# Patient Record
Sex: Female | Born: 1955 | Race: Black or African American | Hispanic: No | Marital: Married | Smoking: Never smoker
Health system: Southern US, Community
[De-identification: ages and names within clinical notes are randomized; demographics above are authoritative.]

## PROBLEM LIST (undated history)

## (undated) DIAGNOSIS — I10 Essential (primary) hypertension: Secondary | ICD-10-CM

---

## 1999-04-21 ENCOUNTER — Ambulatory Visit (HOSPITAL_COMMUNITY): Admission: RE | Admit: 1999-04-21 | Discharge: 1999-04-21 | Payer: Self-pay | Admitting: Family Medicine

## 1999-04-21 ENCOUNTER — Encounter: Payer: Self-pay | Admitting: Family Medicine

## 2000-12-08 ENCOUNTER — Encounter: Payer: Self-pay | Admitting: Obstetrics and Gynecology

## 2000-12-08 ENCOUNTER — Encounter: Admission: RE | Admit: 2000-12-08 | Discharge: 2000-12-08 | Payer: Self-pay | Admitting: Obstetrics and Gynecology

## 2008-06-17 ENCOUNTER — Emergency Department (HOSPITAL_COMMUNITY): Admission: EM | Admit: 2008-06-17 | Discharge: 2008-06-17 | Payer: Self-pay | Admitting: Emergency Medicine

## 2008-06-20 ENCOUNTER — Emergency Department (HOSPITAL_BASED_OUTPATIENT_CLINIC_OR_DEPARTMENT_OTHER): Admission: EM | Admit: 2008-06-20 | Discharge: 2008-06-20 | Payer: Self-pay | Admitting: Emergency Medicine

## 2010-12-29 ENCOUNTER — Encounter: Payer: Self-pay | Admitting: Gastroenterology

## 2011-08-26 LAB — BASIC METABOLIC PANEL
CO2: 23
Chloride: 105
Creatinine, Ser: 0.7
GFR calc Af Amer: >60
Sodium: 137

## 2011-09-27 ENCOUNTER — Other Ambulatory Visit: Payer: Self-pay | Admitting: Gastroenterology

## 2011-09-29 ENCOUNTER — Ambulatory Visit
Admission: RE | Admit: 2011-09-29 | Discharge: 2011-09-29 | Disposition: A | Discharge status: Home / Self Care | Payer: BC Managed Care – PPO | Source: Ambulatory Visit | Attending: Gastroenterology | Admitting: Gastroenterology

## 2011-09-29 ENCOUNTER — Other Ambulatory Visit: Payer: Self-pay

## 2011-09-29 MED ORDER — IOHEXOL 300 MG/ML  SOLN
100.0000 mL | Freq: Once | INTRAMUSCULAR | Status: AC | PRN
  Administered 2011-09-29: 100 mL via INTRAVENOUS

## 2011-09-30 ENCOUNTER — Other Ambulatory Visit: Payer: Self-pay | Admitting: Obstetrics and Gynecology

## 2011-09-30 DIAGNOSIS — Z1231 Encounter for screening mammogram for malignant neoplasm of breast: Secondary | ICD-10-CM

## 2011-10-24 ENCOUNTER — Ambulatory Visit
Admission: RE | Admit: 2011-10-24 | Discharge: 2011-10-24 | Disposition: A | Discharge status: Home / Self Care | Payer: BC Managed Care – PPO | Source: Ambulatory Visit | Attending: Obstetrics and Gynecology | Admitting: Obstetrics and Gynecology

## 2011-10-24 DIAGNOSIS — Z1231 Encounter for screening mammogram for malignant neoplasm of breast: Secondary | ICD-10-CM

## 2013-02-27 ENCOUNTER — Other Ambulatory Visit: Payer: Self-pay | Admitting: Family Medicine

## 2013-02-27 ENCOUNTER — Ambulatory Visit
Admission: RE | Admit: 2013-02-27 | Discharge: 2013-02-27 | Disposition: A | Discharge status: Home / Self Care | Payer: BC Managed Care – PPO | Source: Ambulatory Visit | Attending: Family Medicine | Admitting: Family Medicine

## 2013-02-27 DIAGNOSIS — J189 Pneumonia, unspecified organism: Secondary | ICD-10-CM

## 2019-07-02 ENCOUNTER — Encounter (HOSPITAL_COMMUNITY): Payer: Self-pay

## 2019-07-02 ENCOUNTER — Emergency Department (HOSPITAL_COMMUNITY)
Admission: EM | Admit: 2019-07-02 | Discharge: 2019-07-03 | Discharge status: Against Medical Advice | Payer: BLUE CROSS/BLUE SHIELD | Attending: Emergency Medicine | Admitting: Emergency Medicine

## 2019-07-02 ENCOUNTER — Other Ambulatory Visit: Payer: Self-pay

## 2019-07-02 DIAGNOSIS — Z5321 Procedure and treatment not carried out due to patient leaving prior to being seen by health care provider: Secondary | ICD-10-CM | POA: Diagnosis not present

## 2019-07-02 DIAGNOSIS — M549 Dorsalgia, unspecified: Secondary | ICD-10-CM | POA: Diagnosis present

## 2019-07-02 NOTE — ED Triage Notes (Signed)
[facility]- pt coming from home with c/o fall. She sprayed some cleaner on the ground and slipped and fell. No blood thinners. She has pain to the back and left shoulder. No LOC. Alert and oriented with EMS. Hx of hypertension.   20g R hand  159/78 64 97% room air 97 temp

## 2019-07-03 ENCOUNTER — Encounter (HOSPITAL_BASED_OUTPATIENT_CLINIC_OR_DEPARTMENT_OTHER): Payer: Self-pay | Admitting: Emergency Medicine

## 2019-07-03 ENCOUNTER — Emergency Department (HOSPITAL_BASED_OUTPATIENT_CLINIC_OR_DEPARTMENT_OTHER)
Admission: EM | Admit: 2019-07-03 | Discharge: 2019-07-03 | Disposition: A | Discharge status: Home / Self Care | Payer: BLUE CROSS/BLUE SHIELD | Source: Home / Self Care | Attending: Emergency Medicine | Admitting: Emergency Medicine

## 2019-07-03 ENCOUNTER — Emergency Department (HOSPITAL_BASED_OUTPATIENT_CLINIC_OR_DEPARTMENT_OTHER): Payer: BLUE CROSS/BLUE SHIELD

## 2019-07-03 DIAGNOSIS — I10 Essential (primary) hypertension: Secondary | ICD-10-CM | POA: Insufficient documentation

## 2019-07-03 DIAGNOSIS — W19XXXA Unspecified fall, initial encounter: Secondary | ICD-10-CM

## 2019-07-03 DIAGNOSIS — Y998 Other external cause status: Secondary | ICD-10-CM | POA: Insufficient documentation

## 2019-07-03 DIAGNOSIS — W109XXA Fall (on) (from) unspecified stairs and steps, initial encounter: Secondary | ICD-10-CM | POA: Insufficient documentation

## 2019-07-03 DIAGNOSIS — Y929 Unspecified place or not applicable: Secondary | ICD-10-CM | POA: Insufficient documentation

## 2019-07-03 DIAGNOSIS — M25552 Pain in left hip: Secondary | ICD-10-CM | POA: Insufficient documentation

## 2019-07-03 DIAGNOSIS — Z79899 Other long term (current) drug therapy: Secondary | ICD-10-CM | POA: Insufficient documentation

## 2019-07-03 DIAGNOSIS — Y9389 Activity, other specified: Secondary | ICD-10-CM | POA: Insufficient documentation

## 2019-07-03 DIAGNOSIS — R079 Chest pain, unspecified: Secondary | ICD-10-CM | POA: Insufficient documentation

## 2019-07-03 HISTORY — DX: Essential (primary) hypertension: I10

## 2019-07-03 MED ORDER — ACETAMINOPHEN 500 MG PO TABS
1000.0000 mg | ORAL_TABLET | Freq: Once | ORAL | Status: AC
  Administered 2019-07-03: 1000 mg via ORAL
  Filled 2019-07-03: qty 2

## 2019-07-03 MED ORDER — LIDOCAINE 5 % EX PTCH: 1.0000 | MEDICATED_PATCH | CUTANEOUS | 0 refills | Status: AC

## 2019-07-03 MED ORDER — KETOROLAC TROMETHAMINE 30 MG/ML IJ SOLN
15.0000 mg | Freq: Once | INTRAMUSCULAR | Status: AC
  Administered 2019-07-03: 15 mg via INTRAVENOUS
  Filled 2019-07-03: qty 1

## 2019-07-03 MED ORDER — NAPROXEN 375 MG PO TABS: 375.0000 mg | ORAL_TABLET | Freq: Two times a day (BID) | ORAL | 0 refills | Status: AC

## 2019-07-03 NOTE — ED Provider Notes (Signed)
[facility] Provider Note   CSN: [id number] Arrival date & time: 07/03/19  0136     History   Chief Complaint Chief Complaint  Patient presents with  . Fall    HPI Donna Dougherty is a 63 y.o. female.     The history is provided by the patient and a relative.  Fall This is a new problem. The current episode started 3 to 5 hours ago. The problem occurs rarely. The problem has not changed since onset.Pertinent negatives include no chest pain, no abdominal pain, no headaches and no shortness of breath. Associated symptoms comments: Hit left posterior ribs and pelvic bone as she slid down 4 steps.  Did not hit head no LOC.  . Nothing aggravates the symptoms. Nothing relieves the symptoms. She has tried nothing for the symptoms. The treatment provided no relief.    Past Medical History:  Diagnosis Date  . Hypertension     There are no active problems to display for this patient.   History reviewed. No pertinent surgical history.   OB History   No obstetric history on file.      Home Medications    Prior to Admission medications   Medication Sig Start Date End Date Taking? Authorizing Provider  lidocaine (LIDODERM) 5 % Place 1 patch onto the skin daily. Remove & Discard patch within 12 hours or as directed by MD 07/03/19   Randal Buba, Beckham Capistran, MD  naproxen (NAPROSYN) 375 MG tablet Take 1 tablet (375 mg total) by mouth 2 (two) times daily. 07/03/19   Anddy Wingert, MD  Olmesartan-amLODIPine-HCTZ 40-10-25 MG TABS Take 1 tablet by mouth daily. 05/09/19   [provider]    Family History No family history on file.  Social History Social History   Tobacco Use  . Smoking status: Never Smoker  . Smokeless tobacco: Never Used  Substance Use Topics  . Alcohol use: Not Currently  . Drug use: Not Currently     Allergies   Patient has no known allergies.   Review of Systems Review of Systems  Constitutional: Negative for fever.   HENT: Negative for ear pain.   Eyes: Negative for visual disturbance.  Respiratory: Negative for chest tightness and shortness of breath.   Cardiovascular: Negative for chest pain, palpitations and leg swelling.  Gastrointestinal: Negative for abdominal pain.  Genitourinary: Negative for dysuria.  Musculoskeletal: Positive for arthralgias. Negative for back pain, gait problem, joint swelling, neck pain and neck stiffness.  Neurological: Negative for weakness, numbness and headaches.  Hematological: Negative for adenopathy.  All other systems reviewed and are negative.    Physical Exam Updated Vital Signs BP (!) 153/82 (BP Location: Left Arm)   Pulse 76   Temp 98.1 F (36.7 C) (Oral)   Resp 18   Ht 5\' 6"  (1.676 m)   Wt 74.8 kg   SpO2 100%   BMI 26.63 kg/m   Physical Exam Vitals signs and nursing note reviewed.  Constitutional:      General: She is not in acute distress.    Appearance: She is normal weight.  HENT:     Head: Normocephalic and atraumatic.     Nose: Nose normal.     Mouth/Throat:     Mouth: Mucous membranes are moist.  Eyes:     Conjunctiva/sclera: Conjunctivae normal.     Pupils: Pupils are equal, round, and reactive to light.  Neck:     Musculoskeletal: Normal range of motion and neck supple.  Cardiovascular:  Rate and Rhythm: Normal rate and regular rhythm.     Pulses: Normal pulses.     Heart sounds: Normal heart sounds.  Pulmonary:     Effort: Pulmonary effort is normal. No respiratory distress.     Breath sounds: Normal breath sounds.  Abdominal:     General: Abdomen is flat. Bowel sounds are normal.     Tenderness: There is no abdominal tenderness. There is no guarding or rebound.  Musculoskeletal: Normal range of motion.        General: No tenderness or deformity.     Left shoulder: Normal.     Left elbow: Normal.     Left wrist: Normal.     Left hip: Normal.     Left knee: Normal.     Left ankle: Normal. Achilles tendon normal.      Cervical back: Normal.     Thoracic back: Normal.     Lumbar back: Normal.     Left foot: Normal.  Skin:    General: Skin is warm and dry.     Capillary Refill: Capillary refill takes less than 2 seconds.  Neurological:     General: No focal deficit present.     Mental Status: She is alert and oriented to person, place, and time.  Psychiatric:        Mood and Affect: Mood normal.        Behavior: Behavior normal.      ED Treatments / Results  Labs (all labs ordered are listed, but only abnormal results are displayed) Labs Reviewed - No data to display  EKG None  Radiology Dg Chest 2 View  Result Date: 07/03/2019 CLINICAL DATA:  Recent fall with chest pain, initial encounter EXAM: CHEST - 2 VIEW COMPARISON:  02/27/2013 FINDINGS: Cardiac shadows within normal limits. Calcified granuloma is again noted in the right lung base. No focal infiltrate or sizable effusion is seen. No bony abnormality is noted. IMPRESSION: No acute abnormality noted. Electronically Signed   By: Alcide CleverMark  Lukens M.D.   On: 07/03/2019 02:43   Dg Hips Bilat W Or Wo Pelvis 5 Views  Result Date: 07/03/2019 CLINICAL DATA:  Recent fall EXAM: DG HIP (WITH OR WITHOUT PELVIS) 5+V BILAT COMPARISON:  None. FINDINGS: Pelvic ring is intact. Mild degenerative changes of the hip joints are noted bilaterally. No acute fracture or dislocation is noted. No soft tissue abnormality is seen. IMPRESSION: No acute abnormality noted. Electronically Signed   By: Alcide CleverMark  Lukens M.D.   On: 07/03/2019 02:44    Procedures Procedures (including critical care time)  Medications Ordered in ED Medications  ketorolac (TORADOL) 30 MG/ML injection 15 mg (15 mg Intravenous Given 07/03/19 0213)  acetaminophen (TYLENOL) tablet 1,000 mg (1,000 mg Oral Given 07/03/19 0213)     R.I.C.E. therapy.  Did not hit head no LOC.  No seizures no emesis.  I do not believe there is an acute intracranial abnormality at this time.  There is no indication for imaging  of the head or c spine.  She hit the ribs and pelvic bone.    Final Clinical Impressions(s) / ED Diagnoses   Final diagnoses:  Fall, initial encounter   Return for intractable cough, coughing up blood,fevers >100.4 unrelieved by medication, shortness of breath, intractable vomiting, chest pain, shortness of breath, weakness,numbness, changes in speech, facial asymmetry,abdominal pain, passing out,Inability to tolerate liquids or food, cough, altered mental status or any concerns. No signs of systemic illness or infection. The patient is nontoxic-appearing on exam  and vital signs are within normal limits.   I have reviewed the triage vital signs and the nursing notes. Pertinent labs &imaging results that were available during my care of the patient were reviewed by me and considered in my medical decision making (see chart for details).  After history, exam, and medical workup I feel the patient has been appropriately medically screened and is safe for discharge home. Pertinent diagnoses were discussed with the patient. Patient was given return precautions  ED Discharge Orders         Ordered    lidocaine (LIDODERM) 5 %  Every 24 hours     07/03/19 0252    naproxen (NAPROSYN) 375 MG tablet  2 times daily     07/03/19 0252           Dane Bloch, MD 07/03/19 [id number]

## 2019-07-03 NOTE — ED Notes (Signed)
Pts family stated they are leaving.

## 2019-07-03 NOTE — ED Triage Notes (Signed)
Pt arrives POV from [facility] after leaving without being seen. Reports mechanical fall. Slipped on wet step. Bruising to left flank. No LOC.

## 2022-04-26 ENCOUNTER — Ambulatory Visit (INDEPENDENT_AMBULATORY_CARE_PROVIDER_SITE_OTHER): Payer: BC Managed Care – PPO

## 2022-04-26 ENCOUNTER — Encounter: Payer: Self-pay | Admitting: Emergency Medicine

## 2022-04-26 ENCOUNTER — Ambulatory Visit
Admission: EM | Admit: 2022-04-26 | Discharge: 2022-04-26 | Disposition: A | Discharge status: Home / Self Care | Payer: BC Managed Care – PPO

## 2022-04-26 DIAGNOSIS — J069 Acute upper respiratory infection, unspecified: Secondary | ICD-10-CM

## 2022-04-26 DIAGNOSIS — R0602 Shortness of breath: Secondary | ICD-10-CM

## 2022-04-26 DIAGNOSIS — R059 Cough, unspecified: Secondary | ICD-10-CM

## 2022-04-26 MED ORDER — ALBUTEROL SULFATE HFA 108 (90 BASE) MCG/ACT IN AERS
2.0000 | INHALATION_SPRAY | Freq: Once | RESPIRATORY_TRACT | Status: AC
  Administered 2022-04-26: 2 via RESPIRATORY_TRACT

## 2022-04-26 MED ORDER — PREDNISONE 10 MG (21) PO TBPK: ORAL_TABLET | Freq: Every day | ORAL | 0 refills | Status: DC

## 2022-04-26 NOTE — ED Provider Notes (Signed)
[facility]    CSN: [id number] Arrival date & time: 04/26/22  1533      History   Chief Complaint Chief Complaint  Patient presents with   Cough    HPI Donna Dougherty is a 66 y.o. female.   Patient presents with nasal congestion and cough that has been present for approximately 1 week.  She also endorses some shortness of breath.  Denies history of asthma or COPD and patient is not a smoker.  Patient has taken Mucinex and NyQuil with no improvement in symptoms.  Her daughter is currently has similar symptoms.  Denies chest pain, sore throat, ear pain, nausea, vomiting, diarrhea, abdominal pain.   Cough  Past Medical History:  Diagnosis Date   Hypertension     There are no problems to display for this patient.   History reviewed. No pertinent surgical history.  OB History   No obstetric history on file.      Home Medications    Prior to Admission medications   Medication Sig Start Date End Date Taking? Authorizing Provider  Olmesartan-amLODIPine-HCTZ 40-10-25 MG TABS Take 1 tablet by mouth daily. 05/09/19  Yes [provider]  potassium chloride (KLOR-CON) 10 MEQ tablet Take 10 mEq by mouth daily. 04/25/22  Yes [provider]  predniSONE (STERAPRED UNI-PAK 21 TAB) 10 MG (21) TBPK tablet Take by mouth daily. Take 6 tabs by mouth daily  for 2 days, then 5 tabs for 2 days, then 4 tabs for 2 days, then 3 tabs for 2 days, 2 tabs for 2 days, then 1 tab by mouth daily for 2 days 04/26/22  Yes Jaylon Grode, [place] E, FNP  lidocaine (LIDODERM) 5 % Place 1 patch onto the skin daily. Remove & Discard patch within 12 hours or as directed by MD 07/03/19   Nicanor Alcon, April, MD  naproxen (NAPROSYN) 375 MG tablet Take 1 tablet (375 mg total) by mouth 2 (two) times daily. 07/03/19   Palumbo, April, MD    Family History History reviewed. No pertinent family history.  Social History Social History   Tobacco Use   Smoking status: Never   Smokeless tobacco: Never   Substance Use Topics   Alcohol use: Not Currently   Drug use: Not Currently     Allergies   Patient has no known allergies.   Review of Systems Review of Systems Per HPI  Physical Exam Triage Vital Signs ED Triage Vitals  Enc Vitals Group     BP 04/26/22 1613 106/70     Pulse Rate 04/26/22 1613 74     Resp 04/26/22 1613 18     Temp 04/26/22 1613 97.8 F (36.6 C)     Temp Source 04/26/22 1613 Oral     SpO2 04/26/22 1613 98 %     Weight 04/26/22 1615 162 lb (73.5 kg)     Height 04/26/22 1615 5\' 5"  (1.651 m)     Head Circumference --      Peak Flow --      Pain Score 04/26/22 1614 0     Pain Loc --      Pain Edu? --      Excl. in GC? --    No data found.  Updated Vital Signs BP 106/70 (BP Location: Right Arm)   Pulse 74   Temp 97.8 F (36.6 C) (Oral)   Resp 18   Ht 5\' 5"  (1.651 m)   Wt 162 lb (73.5 kg)   SpO2 98%   BMI  26.96 kg/m   Visual Acuity Right Eye Distance:   Left Eye Distance:   Bilateral Distance:    Right Eye Near:   Left Eye Near:    Bilateral Near:     Physical Exam Constitutional:      General: She is not in acute distress.    Appearance: Normal appearance. She is not toxic-appearing or diaphoretic.  HENT:     Head: Normocephalic and atraumatic.     Right Ear: Tympanic membrane and ear canal normal.     Left Ear: Tympanic membrane and ear canal normal.     Nose: Congestion present.     Mouth/Throat:     Mouth: Mucous membranes are moist.     Pharynx: No posterior oropharyngeal erythema.  Eyes:     Extraocular Movements: Extraocular movements intact.     Conjunctiva/sclera: Conjunctivae normal.     Pupils: Pupils are equal, round, and reactive to light.  Cardiovascular:     Rate and Rhythm: Normal rate and regular rhythm.     Pulses: Normal pulses.     Heart sounds: Normal heart sounds.  Pulmonary:     Effort: Pulmonary effort is normal. No respiratory distress.     Breath sounds: Normal breath sounds. No stridor. No wheezing,  rhonchi or rales.     Comments: Very mild tachypnea noted on exam.  No respiratory distress. Abdominal:     General: Abdomen is flat. Bowel sounds are normal.     Palpations: Abdomen is soft.  Musculoskeletal:        General: Normal range of motion.     Cervical back: Normal range of motion.  Skin:    General: Skin is warm and dry.  Neurological:     General: No focal deficit present.     Mental Status: She is alert and oriented to person, place, and time. Mental status is at baseline.  Psychiatric:        Mood and Affect: Mood normal.        Behavior: Behavior normal.     UC Treatments / Results  Labs (all labs ordered are listed, but only abnormal results are displayed) Labs Reviewed - No data to display  EKG   Radiology DG Chest 2 View  Result Date: 04/26/2022 CLINICAL DATA:  Shortness of breath, cough. EXAM: CHEST - 2 VIEW COMPARISON:  July 03, 2019. FINDINGS: The heart size and mediastinal contours are within normal limits. Stable calcified granuloma seen in left lower lobe. No acute pulmonary disease is noted. The visualized skeletal structures are unremarkable. IMPRESSION: No active cardiopulmonary disease. Electronically Signed   By: Lupita RaiderJames  Green Jr M.D.   On: 04/26/2022 16:43    Procedures Procedures (including critical care time)  Medications Ordered in UC Medications  albuterol (VENTOLIN HFA) 108 (90 Base) MCG/ACT inhaler 2 puff (2 puffs Inhalation Given 04/26/22 1629)    Initial Impression / Assessment and Plan / UC Course  I have reviewed the triage vital signs and the nursing notes.  Pertinent labs & imaging results that were available during my care of the patient were reviewed by me and considered in my medical decision making (see chart for details).     Albuterol inhaler was administered in urgent care and patient states that she has improvement of shortness of breath.  Also improvement in appearance on physical exam.  Chest x-ray was negative for any  acute cardiopulmonary process.  Suspect viral cause to patient's symptoms.  Will treat with prednisone steroid taper to decrease  inflammation associated with shortness of breath and upper respiratory symptoms.  Patient was advised to follow-up if symptoms persist or worsen.  Patient verbalized understanding and was agreeable with plan. Final Clinical Impressions(s) / UC Diagnoses   Final diagnoses:  Viral upper respiratory tract infection with cough  Shortness of breath     Discharge Instructions      Chest x-ray was normal.  It appears that you may have acute bronchitis related to a viral infection.  You are being treated with prednisone steroid taper to decrease inflammation.  Albuterol inhaler has also been prescribed for you to take as needed.  Please follow-up if symptoms persist or worsen.     ED Prescriptions     Medication Sig Dispense Auth. Provider   predniSONE (STERAPRED UNI-PAK 21 TAB) 10 MG (21) TBPK tablet Take by mouth daily. Take 6 tabs by mouth daily  for 2 days, then 5 tabs for 2 days, then 4 tabs for 2 days, then 3 tabs for 2 days, 2 tabs for 2 days, then 1 tab by mouth daily for 2 days 42 tablet [place], Acie Fredrickson, [place]      PDMP not reviewed this encounter.   Gustavus Bryant, [place] 04/26/22 [phone]

## 2022-04-26 NOTE — Discharge Instructions (Signed)
Chest x-ray was normal.  It appears that you may have acute bronchitis related to a viral infection.  You are being treated with prednisone steroid taper to decrease inflammation.  Albuterol inhaler has also been prescribed for you to take as needed.  Please follow-up if symptoms persist or worsen.

## 2022-04-26 NOTE — ED Triage Notes (Signed)
Patient c/o non-productive cough, congestion, nasal drainage x 1 week.  Denies ear pain.  Patient has taken Mucinex and Nyquil.

## 2022-07-28 ENCOUNTER — Ambulatory Visit: Admission: RE | Admit: 2022-07-28 | Payer: BC Managed Care – PPO | Source: Ambulatory Visit

## 2022-07-28 ENCOUNTER — Other Ambulatory Visit: Payer: Self-pay | Admitting: Family Medicine

## 2022-07-28 ENCOUNTER — Ambulatory Visit
Admission: RE | Admit: 2022-07-28 | Discharge: 2022-07-28 | Disposition: A | Discharge status: Home / Self Care | Payer: BC Managed Care – PPO | Source: Ambulatory Visit | Attending: Family Medicine | Admitting: Family Medicine

## 2022-07-28 DIAGNOSIS — M62838 Other muscle spasm: Secondary | ICD-10-CM

## 2023-01-09 ENCOUNTER — Other Ambulatory Visit: Payer: Self-pay | Admitting: Family Medicine

## 2023-01-09 ENCOUNTER — Ambulatory Visit
Admission: RE | Admit: 2023-01-09 | Discharge: 2023-01-09 | Disposition: A | Discharge status: Home / Self Care | Payer: BC Managed Care – PPO | Source: Ambulatory Visit | Attending: Family Medicine | Admitting: Family Medicine

## 2023-01-09 DIAGNOSIS — R0602 Shortness of breath: Secondary | ICD-10-CM

## 2023-01-30 ENCOUNTER — Other Ambulatory Visit: Payer: Self-pay | Admitting: Family Medicine

## 2023-01-30 DIAGNOSIS — E0590 Thyrotoxicosis, unspecified without thyrotoxic crisis or storm: Secondary | ICD-10-CM

## 2023-02-24 ENCOUNTER — Ambulatory Visit
Admission: RE | Admit: 2023-02-24 | Discharge: 2023-02-24 | Disposition: A | Discharge status: Home / Self Care | Payer: BC Managed Care – PPO | Source: Ambulatory Visit | Attending: Family Medicine | Admitting: Family Medicine

## 2023-02-24 DIAGNOSIS — E0590 Thyrotoxicosis, unspecified without thyrotoxic crisis or storm: Secondary | ICD-10-CM

## 2023-03-07 ENCOUNTER — Other Ambulatory Visit: Payer: Self-pay

## 2023-03-07 ENCOUNTER — Emergency Department (HOSPITAL_BASED_OUTPATIENT_CLINIC_OR_DEPARTMENT_OTHER)
Admission: EM | Admit: 2023-03-07 | Discharge: 2023-03-07 | Disposition: A | Discharge status: Home / Self Care | Payer: BC Managed Care – PPO | Attending: Emergency Medicine | Admitting: Emergency Medicine

## 2023-03-07 ENCOUNTER — Encounter (HOSPITAL_BASED_OUTPATIENT_CLINIC_OR_DEPARTMENT_OTHER): Payer: Self-pay

## 2023-03-07 ENCOUNTER — Emergency Department (HOSPITAL_BASED_OUTPATIENT_CLINIC_OR_DEPARTMENT_OTHER): Payer: BC Managed Care – PPO

## 2023-03-07 DIAGNOSIS — J209 Acute bronchitis, unspecified: Secondary | ICD-10-CM | POA: Diagnosis not present

## 2023-03-07 DIAGNOSIS — Z1152 Encounter for screening for COVID-19: Secondary | ICD-10-CM | POA: Insufficient documentation

## 2023-03-07 DIAGNOSIS — J8410 Pulmonary fibrosis, unspecified: Secondary | ICD-10-CM | POA: Diagnosis not present

## 2023-03-07 DIAGNOSIS — Z79899 Other long term (current) drug therapy: Secondary | ICD-10-CM | POA: Insufficient documentation

## 2023-03-07 DIAGNOSIS — I10 Essential (primary) hypertension: Secondary | ICD-10-CM | POA: Insufficient documentation

## 2023-03-07 DIAGNOSIS — R0602 Shortness of breath: Secondary | ICD-10-CM | POA: Diagnosis present

## 2023-03-07 LAB — CBC WITH DIFFERENTIAL/PLATELET
Abs Immature Granulocytes: 0.03 10*3/uL (ref 0.00–0.07)
Basophils Absolute: 0.1 10*3/uL (ref 0.0–0.1)
Basophils Relative: 1 %
Eosinophils Absolute: 0.1 10*3/uL (ref 0.0–0.5)
Eosinophils Relative: 1 %
HCT: 36.8 % (ref 36.0–46.0)
Hemoglobin: 12.8 g/dL (ref 12.0–15.0)
Immature Granulocytes: 0 %
Lymphocytes Relative: 46 %
Lymphs Abs: 4.5 10*3/uL — ABNORMAL HIGH (ref 0.7–4.0)
MCH: 29.4 pg (ref 26.0–34.0)
MCHC: 34.8 g/dL (ref 30.0–36.0)
MCV: 84.4 fL (ref 80.0–100.0)
Monocytes Absolute: 0.7 10*3/uL (ref 0.1–1.0)
Monocytes Relative: 7 %
Neutro Abs: 4.5 10*3/uL (ref 1.7–7.7)
Neutrophils Relative %: 45 %
Platelets: 287 10*3/uL (ref 150–400)
RBC: 4.36 MIL/uL (ref 3.87–5.11)
RDW: 13.4 % (ref 11.5–15.5)
WBC: 9.9 10*3/uL (ref 4.0–10.5)
nRBC: 0 % (ref 0.0–0.2)

## 2023-03-07 LAB — COMPREHENSIVE METABOLIC PANEL
ALT: 27 U/L (ref 0–44)
AST: 27 U/L (ref 15–41)
Albumin: 3.8 g/dL (ref 3.5–5.0)
Alkaline Phosphatase: 68 U/L (ref 38–126)
Anion gap: 9 (ref 5–15)
BUN: 22 mg/dL (ref 8–23)
CO2: 24 mmol/L (ref 22–32)
Calcium: 9.6 mg/dL (ref 8.9–10.3)
Chloride: 104 mmol/L (ref 98–111)
Creatinine, Ser: 1.01 mg/dL — ABNORMAL HIGH (ref 0.44–1.00)
GFR, Estimated: >60 mL/min (ref >60)
Glucose, Bld: 116 mg/dL — ABNORMAL HIGH (ref 70–99)
Potassium: 3.3 mmol/L — ABNORMAL LOW (ref 3.5–5.1)
Sodium: 137 mmol/L (ref 135–145)
Total Bilirubin: 0.9 mg/dL (ref 0.3–1.2)
Total Protein: 7.6 g/dL (ref 6.5–8.1)

## 2023-03-07 LAB — RESP PANEL BY RT-PCR (RSV, FLU A&B, COVID)  RVPGX2
Influenza A by PCR: NEGATIVE
Influenza B by PCR: NEGATIVE
Resp Syncytial Virus by PCR: NEGATIVE
SARS Coronavirus 2 by RT PCR: NEGATIVE

## 2023-03-07 LAB — TROPONIN I (HIGH SENSITIVITY): Troponin I (High Sensitivity): 10 ng/L (ref <18)

## 2023-03-07 LAB — BRAIN NATRIURETIC PEPTIDE: B Natriuretic Peptide: 13 pg/mL (ref 0.0–100.0)

## 2023-03-07 MED ORDER — AZITHROMYCIN 250 MG PO TABS: 250.0000 mg | ORAL_TABLET | Freq: Every day | ORAL | 0 refills | Status: DC

## 2023-03-07 MED ORDER — BENZONATATE 100 MG PO CAPS: 100.0000 mg | ORAL_CAPSULE | Freq: Three times a day (TID) | ORAL | 0 refills | Status: AC

## 2023-03-07 MED ORDER — IOHEXOL 350 MG/ML SOLN
75.0000 mL | Freq: Once | INTRAVENOUS | Status: AC | PRN
  Administered 2023-03-07: 75 mL via INTRAVENOUS

## 2023-03-07 MED ORDER — ALBUTEROL SULFATE HFA 108 (90 BASE) MCG/ACT IN AERS: 2.0000 | INHALATION_SPRAY | RESPIRATORY_TRACT | Status: DC | PRN

## 2023-03-07 MED ORDER — PREDNISONE 10 MG PO TABS: 40.0000 mg | ORAL_TABLET | Freq: Every day | ORAL | 0 refills | Status: AC

## 2023-03-07 NOTE — ED Provider Notes (Signed)
[place] [facility] Provider Note   CSN: [id number] Arrival date & time: 03/07/23  2104     History  Chief Complaint  Patient presents with   Shortness of Breath    Donna Dougherty is a 67 y.o. female.   Shortness of Breath Associated symptoms: chest pain and cough       67 year old female with medical history significant for hypertension who presents to the emergency department with a chief complaint of shortness of breath.  The patient states that she has had intermittent dyspnea for the past month however this week it has been particularly worse.  She endorses pleuritic chest discomfort.  She had URI symptoms around Easter.  She denies any lower extremity swelling.  She denies any fever.  She endorses a mild nonproductive cough.  The patient has noted a slight 10 pound weight loss over an unspecified period of time.  She initially had attributed to thyroid dysfunction.  No history of DVT or PE.  Earlier tonight, she experienced an episode of mild hemoptysis mixed in with her clear sputum which prompted her presentation to the emergency department.  Home Medications Prior to Admission medications   Medication Sig Start Date End Date Taking? Authorizing Provider  azithromycin (ZITHROMAX) 250 MG tablet Take 1 tablet (250 mg total) by mouth daily. Take first 2 tablets together, then 1 every day until finished. 03/07/23  Yes Ernie AvenaLawsing, Asyria Kolander, MD  benzonatate (TESSALON) 100 MG capsule Take 1 capsule (100 mg total) by mouth every 8 (eight) hours. 03/07/23  Yes Ernie AvenaLawsing, Alayziah Tangeman, MD  predniSONE (DELTASONE) 10 MG tablet Take 4 tablets (40 mg total) by mouth daily for 5 days. 03/07/23 03/12/23 Yes Ernie AvenaLawsing, Randol Zumstein, MD  lidocaine (LIDODERM) 5 % Place 1 patch onto the skin daily. Remove & Discard patch within 12 hours or as directed by MD 07/03/19   Nicanor AlconPalumbo, April, MD  naproxen (NAPROSYN) 375 MG tablet Take 1 tablet (375 mg total) by mouth 2 (two) times daily. 07/03/19    Palumbo, April, MD  Olmesartan-amLODIPine-HCTZ 40-10-25 MG TABS Take 1 tablet by mouth daily. 05/09/19   [provider]  potassium chloride (KLOR-CON) 10 MEQ tablet Take 10 mEq by mouth daily. 04/25/22   [provider]      Allergies    Patient has no known allergies.    Review of Systems   Review of Systems  Respiratory:  Positive for cough and shortness of breath.   Cardiovascular:  Positive for chest pain.  All other systems reviewed and are negative.   Physical Exam Updated Vital Signs BP 123/63   Pulse 96   Temp 98.1 F (36.7 C) (Oral)   Resp (!) 25   SpO2 100%  Physical Exam Vitals and nursing note reviewed.  Constitutional:      General: She is not in acute distress.    Appearance: She is well-developed.  HENT:     Head: Normocephalic and atraumatic.  Eyes:     Conjunctiva/sclera: Conjunctivae normal.  Cardiovascular:     Rate and Rhythm: Normal rate and regular rhythm.     Heart sounds: No murmur heard. Pulmonary:     Effort: Pulmonary effort is normal. No respiratory distress.     Breath sounds: Normal breath sounds.  Abdominal:     Palpations: Abdomen is soft.     Tenderness: There is no abdominal tenderness.  Musculoskeletal:        General: No swelling.     Cervical back: Neck  supple.     Right lower leg: No edema.     Left lower leg: No edema.  Skin:    General: Skin is warm and dry.     Capillary Refill: Capillary refill takes less than 2 seconds.  Neurological:     Mental Status: She is alert.  Psychiatric:        Mood and Affect: Mood normal.     ED Results / Procedures / Treatments   Labs (all labs ordered are listed, but only abnormal results are displayed) Labs Reviewed  CBC WITH DIFFERENTIAL/PLATELET - Abnormal; Notable for the following components:      Result Value   Lymphs Abs 4.5 (*)    All other components within normal limits  COMPREHENSIVE METABOLIC PANEL - Abnormal; Notable for the following components:    Potassium 3.3 (*)    Glucose, Bld 116 (*)    Creatinine, Ser 1.01 (*)    All other components within normal limits  RESP PANEL BY RT-PCR (RSV, FLU A&B, COVID)  RVPGX2  BRAIN NATRIURETIC PEPTIDE  TSH  TROPONIN I (HIGH SENSITIVITY)    EKG EKG Interpretation  Date/Time:  Tuesday March 07 2023 21:17:51 EDT Ventricular Rate:  86 PR Interval:  87 QRS Duration: 127 QT Interval:  372 QTC Calculation: 445 R Axis:   50 Text Interpretation: Sinus rhythm Short PR interval Nonspecific intraventricular conduction delay no prior EKGs for comparison Confirmed by Ernie Avena ([id number]) on 03/07/2023 9:51:43 PM  Radiology CT Angio Chest PE W and/or Wo Contrast  Result Date: 03/07/2023 CLINICAL DATA:  Pulmonary embolism (PE) suspected, high prob. Shortness of breath EXAM: CT ANGIOGRAPHY CHEST WITH CONTRAST TECHNIQUE: Multidetector CT imaging of the chest was performed using the standard protocol during bolus administration of intravenous contrast. Multiplanar CT image reconstructions and MIPs were obtained to evaluate the vascular anatomy. RADIATION DOSE REDUCTION: This exam was performed according to the departmental dose-optimization program which includes automated exposure control, adjustment of the mA and/or kV according to patient size and/or use of iterative reconstruction technique. CONTRAST:  12mL OMNIPAQUE IOHEXOL 350 MG/ML SOLN COMPARISON:  None Available. FINDINGS: Cardiovascular: No filling defects in the pulmonary arteries to suggest pulmonary emboli. Heart is normal size. Aorta is normal caliber. Mediastinum/Nodes: No mediastinal, hilar, or axillary adenopathy. Scattered calcified mediastinal and left hilar lymph nodes. Trachea and esophagus are unremarkable. 2.3 cm nodule in the right thyroid lobe. This has been evaluated on previous imaging. (ref: J Am Coll Radiol. 2015 Feb;12(2): 143-50). Lungs/Pleura: Calcified granulomas in the left lung. No confluent opacity or effusion. Upper Abdomen: No acute  findings Musculoskeletal: Chest wall soft tissues are unremarkable. No acute bony abnormality. Review of the MIP images confirms the above findings. IMPRESSION: No evidence of pulmonary embolus. Old granulomatous disease. No acute cardiopulmonary disease. Electronically Signed   By: Charlett Nose M.D.   On: 03/07/2023 23:17   DG Chest 2 View  Result Date: 03/07/2023 CLINICAL DATA:  Shortness of breath EXAM: CHEST - 2 VIEW COMPARISON:  01/09/2023 FINDINGS: No acute airspace disease or pleural effusion. Stable granuloma in the left lower lung. Normal cardiac size. No pneumothorax IMPRESSION: No active cardiopulmonary disease. Electronically Signed   By: Jasmine Pang M.D.   On: 03/07/2023 22:00    Procedures Procedures    Medications Ordered in ED Medications  albuterol (VENTOLIN HFA) 108 (90 Base) MCG/ACT inhaler 2 puff (has no administration in time range)  iohexol (OMNIPAQUE) 350 MG/ML injection 75 mL (75 mLs Intravenous Contrast Given 03/07/23 2258)  ED Course/ Medical Decision Making/ A&P                             Medical Decision Making Amount and/or Complexity of Data Reviewed Labs: ordered. Radiology: ordered.  Risk Prescription drug management.    67 year old female with medical history significant for hypertension who presents to the emergency department with a chief complaint of shortness of breath.  The patient states that she has had intermittent dyspnea for the past month however this week it has been particularly worse.  She endorses pleuritic chest discomfort.  She had URI symptoms around Easter.  She denies any lower extremity swelling.  She denies any fever.  She endorses a mild nonproductive cough.  The patient has noted a slight 10 pound weight loss over an unspecified period of time.  She initially had attributed to thyroid dysfunction.  No history of DVT or PE.  Earlier tonight, she experienced an episode of mild hemoptysis mixed in with her clear sputum which  prompted her presentation to the emergency department.  On arrival, the patient was afebrile, not tachycardic heart rate 96, mildly tachypneic with RR 25, BP 123/63 3, saturating 100% on room air.  Sinus rhythm noted on cardiac telemetry.  Physical exam generally unremarkable.  Patient overall well-appearing.  Differential diagnosis includes acute bronchitis, pulmonary embolism, pneumothorax, pneumonia, viral infection, less likely ACS, less likely pericarditis, less likely aortic dissection.  Initial EKG revealed sinus rhythm, ventricular rate 86, short PR interval, no prior EKGs for comparison, no evidence of STEMI.  A chest x-ray performed revealed no evidence of pneumothorax or pneumonia, no airspace disease or pleural effusion.  Laboratory evaluation significant for initial cardiac troponin 10, BNP unremarkable, CBC without a leukocytosis or anemia, CMP generally unremarkable with mild hypokalemia to 3.3 noted, otherwise unremarkable.  COVID-19, influenza, RSV PCR testing was collected and resulted negative.  CT angiogram PE study was performed: IMPRESSION:  No evidence of pulmonary embolus.    Old granulomatous disease.    No acute cardiopulmonary disease.    No evidence of pneumonia, no evidence of PE, no pericardial effusion.  Patient symptoms are consistent with likely viral bronchitis.  Advised outpatient PCP follow-up.  Will refer to pulmonology for findings of granulomas on lung CT.  Final Clinical Impression(s) / ED Diagnoses Final diagnoses:  Acute bronchitis, unspecified organism  Lung granuloma    Rx / DC Orders ED Discharge Orders          Ordered    predniSONE (DELTASONE) 10 MG tablet  Daily        03/07/23 2322    azithromycin (ZITHROMAX) 250 MG tablet  Daily        03/07/23 2322    benzonatate (TESSALON) 100 MG capsule  Every 8 hours        03/07/23 2322    Ambulatory referral to Pulmonology        03/07/23 2324              Ernie Avena,  MD 03/07/23 2324

## 2023-03-07 NOTE — Discharge Instructions (Signed)
Please follow-up with your PCP to ensure resolution of your acute illness. Suspect likely viral bronchitis.  Your CT imaging did not reveal evidence of pulmonary embolism or pneumonia.  Your lungs did show evidence of old granulomas for which a referral has been placed for follow-up with pulmonology.

## 2023-03-07 NOTE — ED Triage Notes (Signed)
PT POV with family,   C/o ShOB "for a while." Reports this week it has been bad, today worse. Denies fever. Had cold around Easter.

## 2023-03-08 LAB — TSH: TSH: <0.01 u[IU]/mL — ABNORMAL LOW (ref 0.350–4.500)

## 2023-03-14 ENCOUNTER — Encounter: Payer: Self-pay | Admitting: Pulmonary Disease

## 2023-03-14 ENCOUNTER — Ambulatory Visit (INDEPENDENT_AMBULATORY_CARE_PROVIDER_SITE_OTHER): Payer: BC Managed Care – PPO | Admitting: Pulmonary Disease

## 2023-03-14 VITALS — BP 118/72 | HR 71 | Ht 65.0 in | Wt 154.0 lb

## 2023-03-14 DIAGNOSIS — J45998 Other asthma: Secondary | ICD-10-CM

## 2023-03-14 MED ORDER — FLUTICASONE-SALMETEROL 250-50 MCG/ACT IN AEPB
1.0000 | INHALATION_SPRAY | Freq: Two times a day (BID) | RESPIRATORY_TRACT | 5 refills | Status: DC
Start: 2023-03-14 — End: 2023-06-07

## 2023-03-14 MED ORDER — ALBUTEROL SULFATE HFA 108 (90 BASE) MCG/ACT IN AERS
2.0000 | INHALATION_SPRAY | Freq: Four times a day (QID) | RESPIRATORY_TRACT | 2 refills | Status: DC | PRN
Start: 2023-03-14 — End: 2023-05-25

## 2023-03-14 NOTE — Progress Notes (Signed)
Synopsis: Referred in April 2024 for bronchitis by Ernie Avena, MD  Subjective:   PATIENT ID: Donna Dougherty GENDER: female DOB: [birth year], MRN: [id number]  HPI  Chief Complaint  Patient presents with   Consult    Referred by PCP for increased SOB over the past 3 months. Was diagnosed with bronchitis last week at the ED.    Donna Dougherty is a 67 year old woman, never smoker with history of hypertension who is referred to pulmonary clinic for bronchitis.   She was seen 03/07/23 at [facility] for a month of dyspnea, chest discomfort and recent onset of mild hemoptysis mixed in with sputum. She was treated for acute bronchitis with azithromycin. CTA Chest was negative for PE. She felt she caught a viral infection over Easter weekend. Her grand kids had infections.   She is feeling better but continues to have exertional shortness of breath, fatigue and cough. No further hemoptysis. She has intermittent wheezing with the dyspnea. No night time awakenings currently, previously she was.   She is being evaluated for thyroid disease at this time. She had [place] of thyroid with noted nodules. Her PCP is planning referral to endocrinology.   She is a never smoker. She had second hand smoke from her mother in childhood. 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 No childhood issues in her breathing.    Past Medical History:  Diagnosis Date   Hypertension      No family history on file.   Social History   Socioeconomic History   Marital status: Married    Spouse name: Not on file   Number of children: Not on file   Years of education: Not on file   Highest education level: Not on file  Occupational History   Not on file  Tobacco Use   Smoking status: Never   Smokeless tobacco: Never  Vaping Use   Vaping Use: Never used  Substance and Sexual Activity   Alcohol use: Not Currently   Drug use: Not Currently   Sexual  activity: Not on file  Other Topics Concern   Not on file  Social History Narrative   Not on file   Social Determinants of Health   Financial Resource Strain: Not on file  Food Insecurity: Not on file  Transportation Needs: Not on file  Physical Activity: Not on file  Stress: Not on file  Social Connections: Not on file  Intimate Partner Violence: Not on file     No Known Allergies   Outpatient Medications Prior to Visit  Medication Sig Dispense Refill   benzonatate (TESSALON) 100 MG capsule Take 1 capsule (100 mg total) by mouth every 8 (eight) hours. 21 capsule 0   lidocaine (LIDODERM) 5 % Place 1 patch onto the skin daily. Remove & Discard patch within 12 hours or as directed by MD 10 patch 0   metFORMIN (GLUCOPHAGE) 500 MG tablet Take 500 mg by mouth daily with breakfast.     methimazole (TAPAZOLE) 5 MG tablet Take 5 mg by mouth daily.     naproxen (NAPROSYN) 375 MG tablet Take 1 tablet (375 mg total) by mouth 2 (two) times daily. 10 tablet 0   Olmesartan-amLODIPine-HCTZ 40-10-25 MG TABS Take 1 tablet by mouth daily.     potassium chloride (KLOR-CON) 10 MEQ tablet Take 10 mEq by mouth daily.     azithromycin (ZITHROMAX) 250 MG tablet Take 1 tablet (250  mg total) by mouth daily. Take first 2 tablets together, then 1 every day until finished. 6 tablet 0   No facility-administered medications prior to visit.   Review of Systems  Constitutional:  Negative for chills, fever, malaise/fatigue and weight loss.  HENT:  Negative for congestion, sinus pain and sore throat.   Eyes: Negative.   Respiratory:  Positive for cough, sputum production and shortness of breath. Negative for hemoptysis and wheezing.   Cardiovascular:  Negative for chest pain, palpitations, orthopnea, claudication and leg swelling.  Gastrointestinal:  Negative for abdominal pain, heartburn, nausea and vomiting.  Genitourinary: Negative.   Musculoskeletal:  Negative for joint pain and myalgias.  Skin:  Negative  for rash.  Neurological:  Negative for weakness.  Endo/Heme/Allergies: Negative.   Psychiatric/Behavioral: Negative.     Objective:   Vitals:   03/14/23 1023  BP: 118/72  Pulse: 71  SpO2: 100%  Weight: 154 lb (69.9 kg)  Height:  (1.651 m)    Physical Exam Constitutional:      General: She is not in acute distress.    Appearance: She is not ill-appearing.  HENT:     Head: Normocephalic and atraumatic.  Eyes:     General: No scleral icterus.    Conjunctiva/sclera: Conjunctivae normal.     Pupils: Pupils are equal, round, and reactive to light.  Cardiovascular:     Rate and Rhythm: Normal rate and regular rhythm.     Pulses: Normal pulses.     Heart sounds: Normal heart sounds. No murmur heard. Pulmonary:     Effort: Pulmonary effort is normal.     Breath sounds: Normal breath sounds. No wheezing, rhonchi or rales.  Abdominal:     General: Bowel sounds are normal.     Palpations: Abdomen is soft.  Musculoskeletal:     Right lower leg: No edema.     Left lower leg: No edema.  Lymphadenopathy:     Cervical: No cervical adenopathy.  Skin:    General: Skin is warm and dry.  Neurological:     General: No focal deficit present.     Mental Status: She is alert.  Psychiatric:        Mood and Affect: Mood normal.        Behavior: Behavior normal.        Thought Content: Thought content normal.        Judgment: Judgment normal.    CBC    Component Value Date/Time   WBC 9.9 03/07/2023 2217   RBC 4.36 03/07/2023 2217   HGB 12.8 03/07/2023 2217   HCT 36.8 03/07/2023 2217   PLT 287 03/07/2023 2217   MCV 84.4 03/07/2023 2217   MCH 29.4 03/07/2023 2217   MCHC 34.8 03/07/2023 2217   RDW 13.4 03/07/2023 2217   LYMPHSABS 4.5 (H) 03/07/2023 2217   MONOABS 0.7 03/07/2023 2217   EOSABS 0.1 03/07/2023 2217   BASOSABS 0.1 03/07/2023 2217      Latest Ref Rng & Units 03/07/2023   10:17 PM 06/20/2008    9:34 AM  BMP  Glucose 70 - 99 mg/dL 782  956   BUN 8 - 23 mg/dL 22   10   Creatinine 2.13 - 1.00 mg/dL 0.86  0.7   Sodium 578 - 145 mmol/L 137  137   Potassium 3.5 - 5.1 mmol/L 3.3  3.9   Chloride 98 - 111 mmol/L 104  105   CO2 22 - 32 mmol/L 24  23   Calcium  8.9 - 10.3 mg/dL 9.6  9.3    Chest imaging: CTA Chest 03/07/23 Cardiovascular: No filling defects in the pulmonary arteries to suggest pulmonary emboli. Heart is normal size. Aorta is normal caliber.   Mediastinum/Nodes: No mediastinal, hilar, or axillary adenopathy. Scattered calcified mediastinal and left hilar lymph nodes. Trachea and esophagus are unremarkable. 2.3 cm nodule in the right thyroid lobe. This has been evaluated on previous imaging. (ref: J Am Coll Radiol. 2015 Feb;12(2): 143-50).   Lungs/Pleura: Calcified granulomas in the left lung. No confluent opacity or effusion.   PFT:     No data to display          Labs:  Path:  Echo:  Heart Catheterization:  Assessment & Plan:   Post-viral reactive airway disease - Plan: fluticasone-salmeterol (ADVAIR) 250-50 MCG/ACT AEPB, albuterol (VENTOLIN HFA) 108 (90 Base) MCG/ACT inhaler, Pulmonary Function Test  Discussion: Donna Dougherty is a 67 year old woman, never smoker with history of hypertension who is referred to pulmonary clinic for bronchitis.   She is to start Advair discus 250-50 mcg 1 puff twice daily and use albuterol inhaler as needed.  Follow-up in 2 months with pulmonary function test  Melody Comas, MD [facility] Office: [phone]   Current Outpatient Medications:    albuterol (VENTOLIN HFA) 108 (90 Base) MCG/ACT inhaler, Inhale 2 puffs into the lungs every 6 (six) hours as needed for wheezing or shortness of breath., Disp: 8 g, Rfl: 2   benzonatate (TESSALON) 100 MG capsule, Take 1 capsule (100 mg total) by mouth every 8 (eight) hours., Disp: 21 capsule, Rfl: 0   fluticasone-salmeterol (ADVAIR) 250-50 MCG/ACT AEPB, Inhale 1 puff into the lungs in the morning and at bedtime.,  Disp: 60 each, Rfl: 5   lidocaine (LIDODERM) 5 %, Place 1 patch onto the skin daily. Remove & Discard patch within 12 hours or as directed by MD, Disp: 10 patch, Rfl: 0   metFORMIN (GLUCOPHAGE) 500 MG tablet, Take 500 mg by mouth daily with breakfast., Disp: , Rfl:    methimazole (TAPAZOLE) 5 MG tablet, Take 5 mg by mouth daily., Disp: , Rfl:    naproxen (NAPROSYN) 375 MG tablet, Take 1 tablet (375 mg total) by mouth 2 (two) times daily., Disp: 10 tablet, Rfl: 0   Olmesartan-amLODIPine-HCTZ 40-10-25 MG TABS, Take 1 tablet by mouth daily., Disp: , Rfl:    potassium chloride (KLOR-CON) 10 MEQ tablet, Take 10 mEq by mouth daily., Disp: , Rfl:

## 2023-03-14 NOTE — Patient Instructions (Addendum)
Start advair diskus 1 puff twice daily - rinse mouth out after each use  Use albuterol inhaler 1-2 puffs every 4-6 hours as needed for shortness of breath or cough  We will check breathing tests at follow up visit  Follow up in 2 months

## 2023-03-17 ENCOUNTER — Other Ambulatory Visit (HOSPITAL_COMMUNITY): Payer: Self-pay | Admitting: Family Medicine

## 2023-03-17 DIAGNOSIS — E0500 Thyrotoxicosis with diffuse goiter without thyrotoxic crisis or storm: Secondary | ICD-10-CM

## 2023-04-01 ENCOUNTER — Encounter: Payer: Self-pay | Admitting: Pulmonary Disease

## 2023-04-18 ENCOUNTER — Other Ambulatory Visit (HOSPITAL_COMMUNITY): Payer: BC Managed Care – PPO

## 2023-04-19 ENCOUNTER — Other Ambulatory Visit (HOSPITAL_COMMUNITY): Payer: BC Managed Care – PPO

## 2023-05-01 ENCOUNTER — Encounter (HOSPITAL_BASED_OUTPATIENT_CLINIC_OR_DEPARTMENT_OTHER): Payer: BC Managed Care – PPO

## 2023-05-02 ENCOUNTER — Ambulatory Visit: Payer: BC Managed Care – PPO | Admitting: Pulmonary Disease

## 2023-05-04 ENCOUNTER — Encounter (HOSPITAL_COMMUNITY)
Admission: RE | Admit: 2023-05-04 | Discharge: 2023-05-04 | Disposition: A | Discharge status: Home / Self Care | Payer: BC Managed Care – PPO | Source: Ambulatory Visit | Attending: Family Medicine | Admitting: Family Medicine

## 2023-05-04 DIAGNOSIS — E0500 Thyrotoxicosis with diffuse goiter without thyrotoxic crisis or storm: Secondary | ICD-10-CM | POA: Insufficient documentation

## 2023-05-04 MED ORDER — SODIUM IODIDE I-123 7.4 MBQ CAPS
407.0000 | ORAL_CAPSULE | Freq: Once | ORAL | Status: AC
  Administered 2023-05-04: 407 via ORAL

## 2023-05-05 ENCOUNTER — Encounter (HOSPITAL_COMMUNITY)
Admission: RE | Admit: 2023-05-05 | Discharge: 2023-05-05 | Disposition: A | Discharge status: Home / Self Care | Payer: BC Managed Care – PPO | Source: Ambulatory Visit | Attending: Family Medicine | Admitting: Family Medicine

## 2023-05-25 ENCOUNTER — Other Ambulatory Visit: Payer: Self-pay | Admitting: Pulmonary Disease

## 2023-05-25 DIAGNOSIS — J45998 Other asthma: Secondary | ICD-10-CM

## 2023-06-06 ENCOUNTER — Other Ambulatory Visit: Payer: Self-pay | Admitting: Pulmonary Disease

## 2023-06-06 DIAGNOSIS — J45998 Other asthma: Secondary | ICD-10-CM

## 2023-06-23 ENCOUNTER — Ambulatory Visit: Payer: BC Managed Care – PPO | Admitting: Pulmonary Disease

## 2023-08-02 ENCOUNTER — Encounter: Payer: Self-pay | Admitting: Pulmonary Disease

## 2023-08-02 ENCOUNTER — Ambulatory Visit (INDEPENDENT_AMBULATORY_CARE_PROVIDER_SITE_OTHER): Payer: BC Managed Care – PPO | Admitting: Pulmonary Disease

## 2023-08-02 VITALS — BP 114/60 | HR 66 | Temp 98.1°F | Ht 65.5 in | Wt 163.0 lb

## 2023-08-02 DIAGNOSIS — J45998 Other asthma: Secondary | ICD-10-CM | POA: Diagnosis not present

## 2023-08-02 NOTE — Progress Notes (Signed)
Synopsis: Referred in April 2024 for bronchitis by Ernie Avena, MD  Subjective:   PATIENT ID: Donna Dougherty GENDER: female DOB: 03-29-1956, MRN: [id number]  HPI  Chief Complaint  Patient presents with   Follow-up    Improved. C/o a lot of mucus in throat x 7 months.   Donna Dougherty is a 67 year old woman, never smoker with history of hypertension who is returns to pulmonary clinic for post-viral reactive airways disease.   Initial OV 03/14/23 She was seen 03/07/23 at [facility] for a month of dyspnea, chest discomfort and recent onset of mild hemoptysis mixed in with sputum. She was treated for acute bronchitis with azithromycin. CTA Chest was negative for PE. She felt she caught a viral infection over Easter weekend. Her grand kids had infections.   She is feeling better but continues to have exertional shortness of breath, fatigue and cough. No further hemoptysis. She has intermittent wheezing with the dyspnea. No night time awakenings currently, previously she was.   She is being evaluated for thyroid disease at this time. She had [place] of thyroid with noted nodules. Her PCP is planning referral to endocrinology.   She is a never smoker. She had second hand smoke from her mother in childhood. 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 No childhood issues in her breathing.    Today OV 08/02/23 She complains mucous production but no significant issues with cough. No sinus congestion or GERD. She has not been using wixella inhaler scheduled.  Past Medical History:  Diagnosis Date   Hypertension      No family history on file.   Social History   Socioeconomic History   Marital status: Married    Spouse name: Not on file   Number of children: Not on file   Years of education: Not on file   Highest education level: Not on file  Occupational History   Not on file  Tobacco Use   Smoking status: Never   Smokeless  tobacco: Never  Vaping Use   Vaping status: Never Used  Substance and Sexual Activity   Alcohol use: Not Currently   Drug use: Not Currently   Sexual activity: Not on file  Other Topics Concern   Not on file  Social History Narrative   Not on file   Social Determinants of Health   Financial Resource Strain: Not on file  Food Insecurity: Not on file  Transportation Needs: Not on file  Physical Activity: Not on file  Stress: Not on file (01/10/2020)  Social Connections: Not on file  Intimate Partner Violence: Low Risk  (03/11/2020)   Received from [facility]   Intimate Partner Violence    Insults You: Not on file    Threatens You: Not on file    Screams at You: Not on file    Physically Hurt: Not on file    Intimate Partner Violence Score: Not on file     No Known Allergies   Outpatient Medications Prior to Visit  Medication Sig Dispense Refill   D3-50 1.25 MG (50000 UT) capsule Take 50,000 Units by mouth once a week.     KLOR-CON M20 20 MEQ tablet Take 20 mEq by mouth daily.     metoprolol tartrate (LOPRESSOR) 25 MG tablet Take 25 mg by mouth daily.     albuterol (VENTOLIN HFA) 108 (90 Base) MCG/ACT inhaler TAKE 2 PUFFS BY MOUTH  EVERY 6 HOURS AS NEEDED FOR WHEEZE OR SHORTNESS OF BREATH 6.7 each 2   benzonatate (TESSALON) 100 MG capsule Take 1 capsule (100 mg total) by mouth every 8 (eight) hours. 21 capsule 0   fluticasone-salmeterol (WIXELA INHUB) 250-50 MCG/ACT AEPB INHALE 1 PUFF INTO THE LUNGS IN THE MORNING AND AT BEDTIME. (Patient taking differently: Inhale 1 puff into the lungs 2 (two) times daily. in the morning and at bedtime.  Patient is using prn.) 180 each 1   lidocaine (LIDODERM) 5 % Place 1 patch onto the skin daily. Remove & Discard patch within 12 hours or as directed by MD 10 patch 0   metFORMIN (GLUCOPHAGE) 500 MG tablet Take 500 mg by mouth daily with breakfast.     methimazole (TAPAZOLE) 5 MG tablet Take 5 mg by mouth daily.     naproxen  (NAPROSYN) 375 MG tablet Take 1 tablet (375 mg total) by mouth 2 (two) times daily. 10 tablet 0   Olmesartan-amLODIPine-HCTZ 40-10-25 MG TABS Take 1 tablet by mouth daily. (Patient not taking: Reported on 08/02/2023)     potassium chloride (KLOR-CON) 10 MEQ tablet Take 10 mEq by mouth daily.     No facility-administered medications prior to visit.   Review of Systems  Constitutional:  Negative for chills, fever, malaise/fatigue and weight loss.  HENT:  Negative for congestion, sinus pain and sore throat.   Eyes: Negative.   Respiratory:  Positive for cough and sputum production. Negative for hemoptysis, shortness of breath and wheezing.   Cardiovascular:  Negative for chest pain, palpitations, orthopnea, claudication and leg swelling.  Gastrointestinal:  Negative for abdominal pain, heartburn, nausea and vomiting.  Genitourinary: Negative.   Musculoskeletal:  Negative for joint pain and myalgias.  Skin:  Negative for rash.  Neurological:  Negative for weakness.  Endo/Heme/Allergies: Negative.   Psychiatric/Behavioral: Negative.     Objective:   Vitals:   08/02/23 1312  BP: 114/60  Pulse: 66  Temp: 98.1 F (36.7 C)  TempSrc: Oral  SpO2: 100%  Weight: 163 lb (73.9 kg)  Height: 5' 5.5" (1.664 m)   Physical Exam Constitutional:      General: She is not in acute distress.    Appearance: She is not ill-appearing.  HENT:     Head: Normocephalic and atraumatic.  Eyes:     General: No scleral icterus.    Conjunctiva/sclera: Conjunctivae normal.  Cardiovascular:     Rate and Rhythm: Normal rate and regular rhythm.     Pulses: Normal pulses.     Heart sounds: Normal heart sounds. No murmur heard. Pulmonary:     Effort: Pulmonary effort is normal.     Breath sounds: Normal breath sounds. No wheezing, rhonchi or rales.  Musculoskeletal:     Right lower leg: No edema.     Left lower leg: No edema.  Skin:    General: Skin is warm and dry.  Neurological:     General: No focal  deficit present.     Mental Status: She is alert.    CBC    Component Value Date/Time   WBC 9.9 03/07/2023 2217   RBC 4.36 03/07/2023 2217   HGB 12.8 03/07/2023 2217   HCT 36.8 03/07/2023 2217   PLT 287 03/07/2023 2217   MCV 84.4 03/07/2023 2217   MCH 29.4 03/07/2023 2217   MCHC 34.8 03/07/2023 2217   RDW 13.4 03/07/2023 2217   LYMPHSABS 4.5 (H) 03/07/2023 2217   MONOABS 0.7 03/07/2023 2217   EOSABS 0.1 03/07/2023  2217   BASOSABS 0.1 03/07/2023 2217      Latest Ref Rng & Units 03/07/2023   10:17 PM 06/20/2008    9:34 AM  BMP  Glucose 70 - 99 mg/dL 295  621   BUN 8 - 23 mg/dL 22  10   Creatinine 3.08 - 1.00 mg/dL 6.57  0.7   Sodium 846 - 145 mmol/L 137  137   Potassium 3.5 - 5.1 mmol/L 3.3  3.9   Chloride 98 - 111 mmol/L 104  105   CO2 22 - 32 mmol/L 24  23   Calcium 8.9 - 10.3 mg/dL 9.6  9.3    Chest imaging: CTA Chest 03/07/23 Cardiovascular: No filling defects in the pulmonary arteries to suggest pulmonary emboli. Heart is normal size. Aorta is normal caliber.   Mediastinum/Nodes: No mediastinal, hilar, or axillary adenopathy. Scattered calcified mediastinal and left hilar lymph nodes. Trachea and esophagus are unremarkable. 2.3 cm nodule in the right thyroid lobe. This has been evaluated on previous imaging. (ref: J Am Coll Radiol. 2015 Feb;12(2): 143-50).   Lungs/Pleura: Calcified granulomas in the left lung. No confluent opacity or effusion.   PFT:     No data to display          Labs:  Path:  Echo:  Heart Catheterization:  Assessment & Plan:   Post-viral reactive airway disease  Discussion: Donna Dougherty is a 67 year old woman, never smoker with history of hypertension who returns to pulmonary clinic for bronchitis/post-viral reactive airways disease.   She is to use wixella 250-50 mcg 1 puff twice daily and use albuterol inhaler as needed.  Follow-up in 6 months.   Melody Comas, MD [facility] Office:  [phone]   Current Outpatient Medications:    D3-50 1.25 MG (50000 UT) capsule, Take 50,000 Units by mouth once a week., Disp: , Rfl:    KLOR-CON M20 20 MEQ tablet, Take 20 mEq by mouth daily., Disp: , Rfl:    metoprolol tartrate (LOPRESSOR) 25 MG tablet, Take 25 mg by mouth daily., Disp: , Rfl:    albuterol (VENTOLIN HFA) 108 (90 Base) MCG/ACT inhaler, TAKE 2 PUFFS BY MOUTH EVERY 6 HOURS AS NEEDED FOR WHEEZE OR SHORTNESS OF BREATH, Disp: 6.7 each, Rfl: 2   benzonatate (TESSALON) 100 MG capsule, Take 1 capsule (100 mg total) by mouth every 8 (eight) hours., Disp: 21 capsule, Rfl: 0   fluticasone-salmeterol (WIXELA INHUB) 250-50 MCG/ACT AEPB, INHALE 1 PUFF INTO THE LUNGS IN THE MORNING AND AT BEDTIME. (Patient taking differently: Inhale 1 puff into the lungs 2 (two) times daily. in the morning and at bedtime.  Patient is using prn.), Disp: 180 each, Rfl: 1   lidocaine (LIDODERM) 5 %, Place 1 patch onto the skin daily. Remove & Discard patch within 12 hours or as directed by MD, Disp: 10 patch, Rfl: 0   metFORMIN (GLUCOPHAGE) 500 MG tablet, Take 500 mg by mouth daily with breakfast., Disp: , Rfl:    methimazole (TAPAZOLE) 5 MG tablet, Take 5 mg by mouth daily., Disp: , Rfl:    naproxen (NAPROSYN) 375 MG tablet, Take 1 tablet (375 mg total) by mouth 2 (two) times daily., Disp: 10 tablet, Rfl: 0   Olmesartan-amLODIPine-HCTZ 40-10-25 MG TABS, Take 1 tablet by mouth daily. (Patient not taking: Reported on 08/02/2023), Disp: , Rfl:

## 2023-08-02 NOTE — Patient Instructions (Addendum)
Use wixella inhaler 1 puff twice daily - rinse mouth out after each use.   Use albuerol inhaler as needed 1- 2 puffs every 4-6 hours   Follow up in 6 months

## 2023-08-10 ENCOUNTER — Encounter: Payer: Self-pay | Admitting: Pulmonary Disease

## 2023-09-12 ENCOUNTER — Encounter: Payer: Self-pay | Admitting: Pulmonary Disease

## 2023-11-12 IMAGING — DX DG CHEST 2V
2 series · 2 of 2 positions shown · non-contrast
Comparison: July 03, 2019.

CLINICAL DATA: Shortness of breath, cough.

EXAM:
CHEST - 2 VIEW

[chest pa]
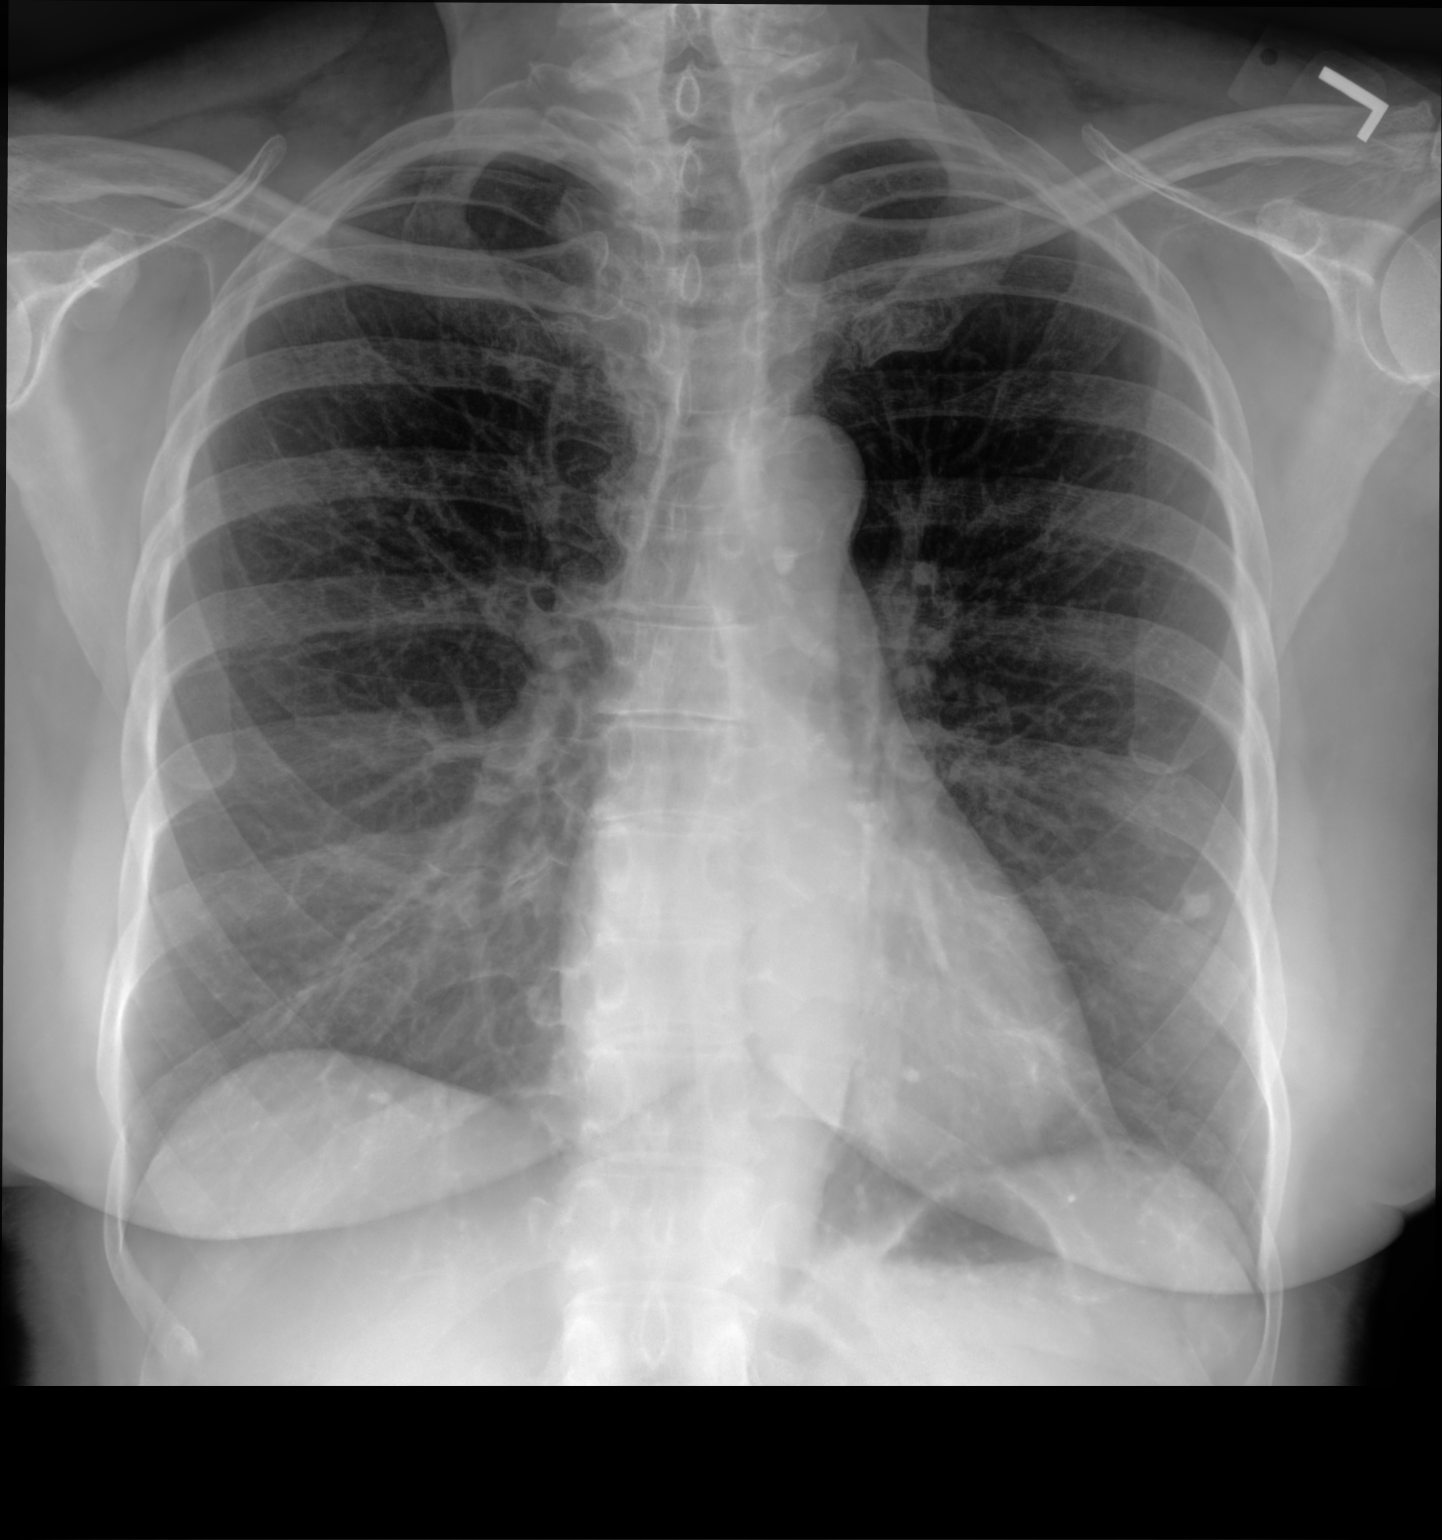

[chest lat]
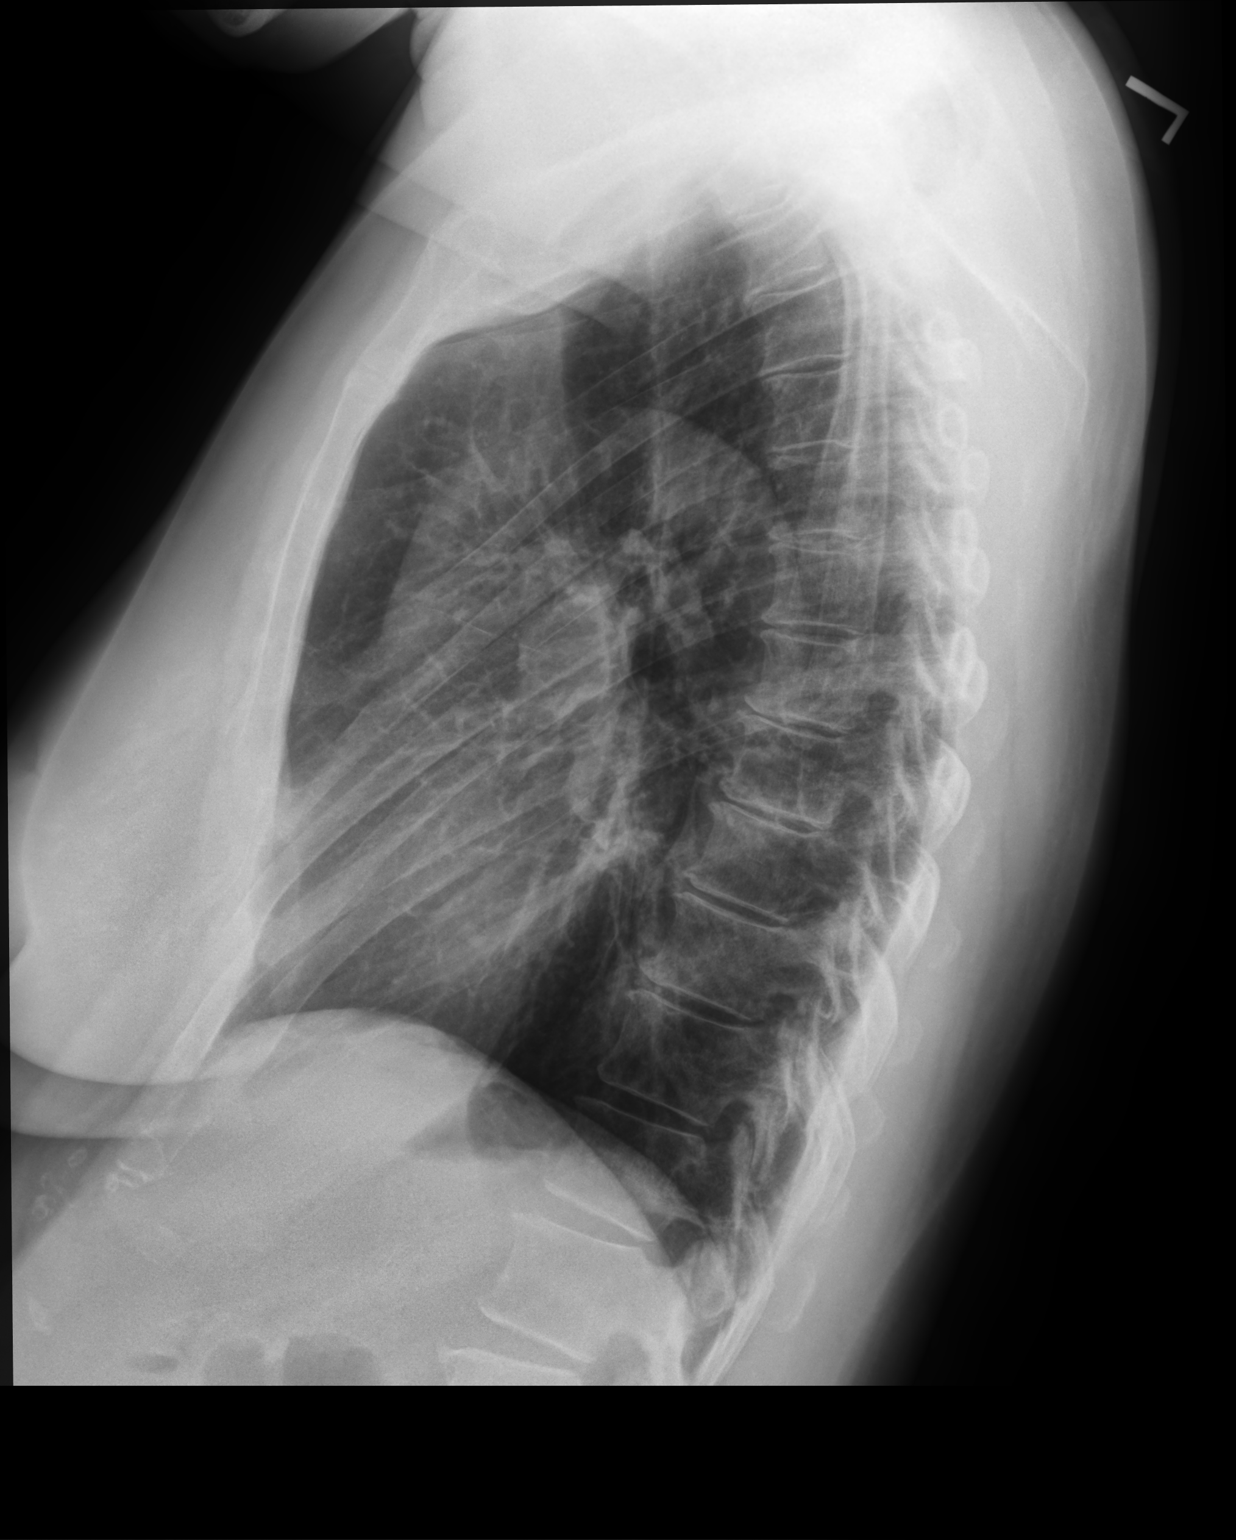

[2 of 2 positions shown; findings below may reference images not displayed]

FINDINGS: The heart size and mediastinal contours are within normal limits.
Stable calcified granuloma seen in left lower lobe. No acute
pulmonary disease is noted. The visualized skeletal structures are
unremarkable.
IMPRESSION: No active cardiopulmonary disease.

## 2024-07-22 ENCOUNTER — Other Ambulatory Visit (HOSPITAL_COMMUNITY): Payer: Self-pay | Admitting: Family Medicine

## 2024-07-22 DIAGNOSIS — R1310 Dysphagia, unspecified: Secondary | ICD-10-CM

## 2024-08-01 ENCOUNTER — Ambulatory Visit (HOSPITAL_COMMUNITY)
Admission: RE | Admit: 2024-08-01 | Discharge: 2024-08-01 | Disposition: A | Discharge status: Home / Self Care | Source: Ambulatory Visit | Attending: Family Medicine | Admitting: Family Medicine

## 2024-08-01 DIAGNOSIS — R1310 Dysphagia, unspecified: Secondary | ICD-10-CM | POA: Diagnosis present

## 2025-01-08 ENCOUNTER — Ambulatory Visit: Admitting: Family Medicine
